# Patient Record
Sex: Female | Born: 1940 | Race: White | Hispanic: No | Smoking: Current every day smoker
Health system: Southern US, Community
[De-identification: ages and names within clinical notes are randomized; demographics above are authoritative.]

## PROBLEM LIST (undated history)

## (undated) DIAGNOSIS — H3530 Unspecified macular degeneration: Secondary | ICD-10-CM

## (undated) DIAGNOSIS — M1990 Unspecified osteoarthritis, unspecified site: Secondary | ICD-10-CM

## (undated) DIAGNOSIS — I10 Essential (primary) hypertension: Secondary | ICD-10-CM

## (undated) DIAGNOSIS — E039 Hypothyroidism, unspecified: Secondary | ICD-10-CM

## (undated) DIAGNOSIS — R0600 Dyspnea, unspecified: Secondary | ICD-10-CM

## (undated) HISTORY — PX: LUMBAR FUSION: SHX111

## (undated) HISTORY — PX: APPENDECTOMY: SHX54

## (undated) HISTORY — PX: HEEL SPUR SURGERY: SHX665

## (undated) HISTORY — PX: CATARACT EXTRACTION: SUR2

## (undated) HISTORY — PX: CERVICAL FUSION: SHX112

## (undated) HISTORY — PX: CHOLECYSTECTOMY: SHX55

## (undated) HISTORY — PX: ABDOMINAL HYSTERECTOMY: SHX81

---

## 2021-08-10 NOTE — Progress Notes (Signed)
Sent message, via epic in basket, requesting orders in epic from surgeon.  

## 2021-08-11 NOTE — Patient Instructions (Addendum)
DUE TO COVID-19 ONLY ONE VISITOR  (aged 79 and older)  IS ALLOWED TO COME WITH YOU AND STAY IN THE WAITING ROOM ONLY DURING PRE OP AND PROCEDURE.   ?**NO VISITORS ARE ALLOWED IN THE SHORT STAY AREA OR RECOVERY ROOM!!**  ? ? Your procedure is scheduled on: 08/17/21 ? ? Report to [facility] Main Entrance ? ?  Report to admitting at 5:30 AM ? ? Call this number if you have problems the morning of surgery [phone] ? ? Do not eat food :After Midnight. ? ? After Midnight you may have the following liquids until 4:30 AM DAY OF SURGERY ? ?Water ?Black Coffee (sugar ok, NO MILK/CREAM OR CREAMERS)  ?Tea (sugar ok, NO MILK/CREAM OR CREAMERS) regular and decaf                             ?Plain Jell-O (NO RED)                                           ?Fruit ices (not with fruit pulp, NO RED)                                     ?Popsicles (NO RED)                                                                  ?Juice: apple, WHITE grape, WHITE cranberry ?Sports drinks like Gatorade (NO RED) ?Clear broth(vegetable,chicken,beef)  ?  ?The day of surgery:  ?Drink ONE (1) Pre-Surgery Clear Ensure at 4:30 AM the morning of surgery. Drink in one sitting. Do not sip.  ?This drink was given to you during your hospital  ?pre-op appointment visit. ?Nothing else to drink after completing the  ?Pre-Surgery Clear Ensure. ?  ?       If you have questions, please contact your surgeon?s office. ? ? ?FOLLOW BOWEL PREP AND ANY ADDITIONAL PRE OP INSTRUCTIONS YOU RECEIVED FROM YOUR SURGEON'S OFFICE!!! ?  ?  ?Oral Hygiene is also important to reduce your risk of infection.                                    ?Remember - BRUSH YOUR TEETH THE MORNING OF SURGERY WITH YOUR REGULAR TOOTHPASTE ? ? Do NOT smoke after Midnight ? ? Take these medicines the morning of surgery with A SIP OF WATER: Tylenol, Norco, Inhalers, Levothyroxine, Zofran ?                  ?           You may not have any metal on your body including hair pins, jewelry, and  body piercing ? ?           Do not wear make-up, lotions, powders, perfumes, or deodorant ? ?Do not wear nail polish including gel and S&S, artificial/acrylic nails, or any other type of covering on natural nails including finger and toenails. If  you have artificial nails, gel coating, etc. that needs to be removed by a nail salon please have this removed prior to surgery or surgery may need to be canceled/ delayed if the surgeon/ anesthesia feels like they are unable to be safely monitored.  ? ?Do not shave  48 hours prior to surgery.  ? ? Do not bring valuables to the hospital. [facility] IS NOT ?            RESPONSIBLE   FOR VALUABLES. ? ? Contacts, dentures or bridgework may not be worn into surgery. ? ? Patients discharged on the day of surgery will not be allowed to drive home.  Someone NEEDS to stay with you for the first 24 hours after anesthesia. ? ?            Please read over the following fact sheets you were given: IF YOU HAVE QUESTIONS ABOUT YOUR PRE-OP INSTRUCTIONS PLEASE CALL [phone]- Fleet ContrasRachel ? ?   [facility] - Preparing for Surgery ?Before surgery, you can play an important role.  Because skin is not sterile, your skin needs to be as free of germs as possible.  You can reduce the number of germs on your skin by washing with CHG (chlorahexidine gluconate) soap before surgery.  CHG is an antiseptic cleaner which kills germs and bonds with the skin to continue killing germs even after washing. ?Please DO NOT use if you have an allergy to CHG or antibacterial soaps.  If your skin becomes reddened/irritated stop using the CHG and inform your nurse when you arrive at Short Stay. ?Do not shave (including legs and underarms) for at least 48 hours prior to the first CHG shower.  You may shave your face/neck. ? ?Please follow these instructions carefully: ? 1.  Shower with CHG Soap the night before surgery and the  morning of surgery. ? 2.  If you choose to wash your hair, wash your hair first as usual  with your normal  shampoo. ? 3.  After you shampoo, rinse your hair and body thoroughly to remove the shampoo.                            ? 4.  Use CHG as you would any other liquid soap.  You can apply chg directly to the skin and wash.  Gently with a scrungie or clean washcloth. ? 5.  Apply the CHG Soap to your body ONLY FROM THE NECK DOWN.   Do   not use on face/ open      ?                     Wound or open sores. Avoid contact with eyes, ears mouth and   genitals (private parts).  ?                     Engineering geologistWash face,  Genitals (private parts) with your normal soap. ?            6.  Wash thoroughly, paying special attention to the area where your    surgery  will be performed. ? 7.  Thoroughly rinse your body with warm water from the neck down. ? 8.  DO NOT shower/wash with your normal soap after using and rinsing off the CHG Soap. ?               9.  Pat yourself dry with  a clean towel. ?           10.  Wear clean pajamas. ?           11.  Place clean sheets on your bed the night of your first shower and do not  sleep with pets. ?Day of Surgery : ?Do not apply any lotions/deodorants the morning of surgery.  Please wear clean clothes to the hospital/surgery center. ? ?FAILURE TO FOLLOW THESE INSTRUCTIONS MAY RESULT IN THE CANCELLATION OF YOUR SURGERY ? ?PATIENT SIGNATURE_________________________________ ? ?NURSE SIGNATURE__________________________________ ? ?________________________________________________________________________  ? ?Incentive Spirometer ? ?An incentive spirometer is a tool that can help keep your lungs clear and active. This tool measures how well you are filling your lungs with each breath. Taking long deep breaths may help reverse or decrease the chance of developing breathing (pulmonary) problems (especially infection) following: ?A long period of time when you are unable to move or be active. ?BEFORE THE PROCEDURE  ?If the spirometer includes an indicator to show your best effort, your nurse or  respiratory therapist will set it to a desired goal. ?If possible, sit up straight or lean slightly forward. Try not to slouch. ?Hold the incentive spirometer in an upright position. ?INSTRUCTIONS FOR USE  ?Sit on the edge of your bed if possible, or sit up as far as you can in bed or on a chair. ?Hold the incentive spirometer in an upright position. ?Breathe out normally. ?Place the mouthpiece in your mouth and seal your lips tightly around it. ?Breathe in slowly and as deeply as possible, raising the piston or the ball toward the top of the column. ?Hold your breath for 3-5 seconds or for as long as possible. Allow the piston or ball to fall to the bottom of the column. ?Remove the mouthpiece from your mouth and breathe out normally. ?Rest for a few seconds and repeat Steps 1 through 7 at least 10 times every 1-2 hours when you are awake. Take your time and take a few normal breaths between deep breaths. ?The spirometer may include an indicator to show your best effort. Use the indicator as a goal to work toward during each repetition. ?After each set of 10 deep breaths, practice coughing to be sure your lungs are clear. If you have an incision (the cut made at the time of surgery), support your incision when coughing by placing a pillow or rolled up towels firmly against it. ?Once you are able to get out of bed, walk around indoors and cough well. You may stop using the incentive spirometer when instructed by your caregiver.  ?RISKS AND COMPLICATIONS ?Take your time so you do not get dizzy or light-headed. ?If you are in pain, you may need to take or ask for pain medication before doing incentive spirometry. It is harder to take a deep breath if you are having pain. ?AFTER USE ?Rest and breathe slowly and easily. ?It can be helpful to keep track of a log of your progress. Your caregiver can provide you with a simple table to help with this. ?If you are using the spirometer at home, follow these  instructions: ?SEEK MEDICAL CARE IF:  ?You are having difficultly using the spirometer. ?You have trouble using the spirometer as often as instructed. ?Your pain medication is not giving enough relief while using the

## 2021-08-12 ENCOUNTER — Encounter (HOSPITAL_COMMUNITY)
Admission: RE | Admit: 2021-08-12 | Discharge: 2021-08-12 | Disposition: A | Discharge status: Home / Self Care | Payer: Medicare Other | Source: Ambulatory Visit | Attending: Orthopaedic Surgery | Admitting: Orthopaedic Surgery

## 2021-08-12 ENCOUNTER — Other Ambulatory Visit: Payer: Self-pay

## 2021-08-12 ENCOUNTER — Encounter (HOSPITAL_COMMUNITY): Payer: Self-pay

## 2021-08-12 ENCOUNTER — Encounter (INDEPENDENT_AMBULATORY_CARE_PROVIDER_SITE_OTHER): Payer: Self-pay

## 2021-08-12 VITALS — BP 158/70 | HR 71 | Temp 98.5°F | Resp 16 | Ht 65.0 in | Wt 144.0 lb

## 2021-08-12 DIAGNOSIS — Z01818 Encounter for other preprocedural examination: Secondary | ICD-10-CM | POA: Insufficient documentation

## 2021-08-12 HISTORY — DX: Dyspnea, unspecified: R06.00

## 2021-08-12 HISTORY — DX: Unspecified osteoarthritis, unspecified site: M19.90

## 2021-08-12 HISTORY — DX: Unspecified macular degeneration: H35.30

## 2021-08-12 HISTORY — DX: Essential (primary) hypertension: I10

## 2021-08-12 HISTORY — DX: Hypothyroidism, unspecified: E03.9

## 2021-08-12 LAB — BASIC METABOLIC PANEL
Anion gap: 6 (ref 5–15)
BUN: 18 mg/dL (ref 8–23)
CO2: 26 mmol/L (ref 22–32)
Calcium: 8.9 mg/dL (ref 8.9–10.3)
Chloride: 101 mmol/L (ref 98–111)
Creatinine, Ser: 0.96 mg/dL (ref 0.44–1.00)
GFR, Estimated: >60 mL/min (ref >60)
Glucose, Bld: 90 mg/dL (ref 70–99)
Potassium: 4.2 mmol/L (ref 3.5–5.1)
Sodium: 133 mmol/L — ABNORMAL LOW (ref 135–145)

## 2021-08-12 LAB — CBC
HCT: 36.2 % (ref 36.0–46.0)
Hemoglobin: 11.6 g/dL — ABNORMAL LOW (ref 12.0–15.0)
MCH: 32.2 pg (ref 26.0–34.0)
MCHC: 32 g/dL (ref 30.0–36.0)
MCV: 100.6 fL — ABNORMAL HIGH (ref 80.0–100.0)
Platelets: 323 10*3/uL (ref 150–400)
RBC: 3.6 MIL/uL — ABNORMAL LOW (ref 3.87–5.11)
RDW: 13.3 % (ref 11.5–15.5)
WBC: 6.9 10*3/uL (ref 4.0–10.5)
nRBC: 0 % (ref 0.0–0.2)

## 2021-08-12 LAB — SURGICAL PCR SCREEN
MRSA, PCR: NEGATIVE
Staphylococcus aureus: NEGATIVE

## 2021-08-12 NOTE — Progress Notes (Addendum)
Patient lives in [place]. Her son lives here and she is staying with him currently. ? ?COVID Vaccine Completed: yes x1 ?Date COVID Vaccine completed: ?Has received booster: ?COVID vaccine manufacturer: [employer]  ? ?Date of COVID positive in last 90 days: no ? ?PCP - Lattie Corns [place] ?Cardiologist - n/a ? ?Chest x-ray - n/a ?EKG - 08/12/21 Epic/chart ?Stress Test - yes 15 plus years per pt ?ECHO - n/a ?Cardiac Cath - n/a ?Pacemaker/ICD device last checked: n/a ?Spinal Cord Stimulator: n/a ? ?Bowel Prep - no ? ?Sleep Study - n/a ?CPAP -  ? ?Fasting Blood Sugar - n/a ?Checks Blood Sugar _____ times a day ? ?Blood Thinner Instructions: n/a ?Aspirin Instructions: ?Last Dose: ? ?Activity level: Can go up a flight of stairs and perform activities of daily living without stopping and without symptoms of chest pain. SOB due to smoking ?   ?Anesthesia review:  ? ?Patient denies shortness of breath, fever, cough and chest pain at PAT appointment ? ? ?Patient verbalized understanding of instructions that were given to them at the PAT appointment. Patient was also instructed that they will need to review over the PAT instructions again at home before surgery.  ?

## 2021-08-14 NOTE — H&P (Signed)
BMI: ?There is no height or weight on file to calculate BMI. ? ?No results found for: ALBUMIN ? ? ?Diabetes: ?Patient does not have a diagnosis of diabetes. ?  ? ?.  ?  ?Smoking Status: ?Social History  ? ?Tobacco Use  ?Smoking Status Every Day  ? Packs/day: 0.50  ? Types: Cigarettes  ?Smokeless Tobacco Never  ? ?Ready to quit: Not Answered ?Counseling given: Not Answered ? ? ?Non-elective surgery.  No smoking cessation applicable. ? ? ? ? ? ? ? ?

## 2021-08-16 NOTE — H&P (Signed)
? ? ?PREOPERATIVE H&P ? ?Chief Complaint: RIGHT SHOULDER FRACTURE ? ?HPI: ?Sara Dillon is a 81 y.o. female who is scheduled for Procedure(s): ?REVERSE SHOULDER ARTHROPLASTY ?OPEN REDUCTION INTERNAL FIXATION (ORIF) PROXIMAL HUMERUS FRACTURE.  ? ?Patient is a 81 year-old female who presents today with a known proximal humerus fracture that occurred on March 12th.  She states that she was in [place] and was coming out of a doorframe when she missed a step.  She fell and landed on her right shoulder.  She was seen in an emergency room in [place] and was told that she had a fracture in her shoulder and that she needed to follow up with an orthopedist for likely surgery.  She has been having pain since then and has been taking Hydrocodone, as well as nausea medication for this.  She was seen by an orthopedist in [place] as well.  She actually lives in [place], but was brought here to stay with family.  Her family is planning to move to on March 30th.   ? ?Past medical history: Significant for hypertension and hypothyroidism.  She does not have a history of diabetes specifically. Her primary care provider is Dr. Lattie Corns in [place] with the [facility].   ? ?Current medications: Losartan and Levothyroxine 88 mcg.  She is not on any blood thinners.   ? ?Symptoms are rated as moderate to severe, and have been worsening.  This is significantly impairing activities of daily living.   ? ?Please see clinic note for further details on this patient's care.   ? ?She has elected for surgical management.  ? ?Past Medical History:  ?Diagnosis Date  ? Arthritis   ? Dyspnea   ? Hypertension   ? Hypothyroidism   ? Macular degeneration   ? ?Past Surgical History:  ?Procedure Laterality Date  ? ABDOMINAL HYSTERECTOMY    ? APPENDECTOMY    ? CATARACT EXTRACTION Bilateral   ? CERVICAL FUSION    ? CHOLECYSTECTOMY    ? HEEL SPUR SURGERY Left   ? LUMBAR FUSION    ? ?Social History  ? ?Socioeconomic History  ? Marital status:  Widowed  ?  Spouse name: Not on file  ? Number of children: Not on file  ? Years of education: Not on file  ? Highest education level: Not on file  ?Occupational History  ? Not on file  ?Tobacco Use  ? Smoking status: Every Day  ?  Packs/day: 0.50  ?  Types: Cigarettes  ? Smokeless tobacco: Never  ?Vaping Use  ? Vaping Use: Never used  ?Substance and Sexual Activity  ? Alcohol use: Yes  ?  Alcohol/week: 7.0 standard drinks  ?  Types: 7 Glasses of wine per week  ? Drug use: Never  ? Sexual activity: Not on file  ?Other Topics Concern  ? Not on file  ?Social History Narrative  ? Not on file  ? ?Social Determinants of Health  ? ?Financial Resource Strain: Not on file  ?Food Insecurity: Not on file  ?Transportation Needs: Not on file  ?Physical Activity: Not on file  ?Stress: Not on file  ?Social Connections: Not on file  ? ?No family history on file. ?Allergies  ?Allergen Reactions  ? Hydrocodone Nausea And Vomiting  ? Morphine Nausea And Vomiting  ? ?Prior to Admission medications   ?Medication Sig Start Date End Date Taking? Authorizing Provider  ?acetaminophen (TYLENOL) 325 MG tablet Take 650 mg by mouth every 6 (six) hours as needed for  moderate pain.   Yes [provider]  ?acidophilus (RISAQUAD) CAPS capsule Take 1 capsule by mouth daily.   Yes [provider]  ?Calcium Carb-Cholecalciferol (CALTRATE 600+D3 PO) Take 1 tablet by mouth daily.   Yes [provider]  ?cholecalciferol (VITAMIN D3) 25 MCG (1000 UNIT) tablet Take 1,000 Units by mouth daily.   Yes [provider]  ?HYDROcodone-acetaminophen (NORCO) 10-325 MG tablet Take 1 tablet by mouth every 6 (six) hours as needed for moderate pain.   Yes [provider]  ?levalbuterol (XOPENEX HFA) 45 MCG/ACT inhaler Inhale 1-2 puffs into the lungs every 4 (four) hours as needed for wheezing or shortness of breath.   Yes [provider]  ?levothyroxine (SYNTHROID) 88 MCG tablet Take 88 mcg by mouth daily before  breakfast.   Yes [provider]  ?losartan (COZAAR) 25 MG tablet Take 25 mg by mouth daily.   Yes [provider]  ?Multiple Vitamin (MULTIVITAMIN WITH MINERALS) TABS tablet Take 1 tablet by mouth daily.   Yes [provider]  ?ondansetron (ZOFRAN) 4 MG tablet Take 4 mg by mouth every 8 (eight) hours as needed for nausea or vomiting.   Yes [provider]  ?Phenazopyridine HCl (AZO TABS PO) Take 1 tablet by mouth daily.   Yes [provider]  ?Polyethyl Glycol-Propyl Glycol (SYSTANE OP) Place 1 drop into both eyes 2 (two) times daily as needed (dry eyes).   Yes [provider]  ?vitamin B-12 (CYANOCOBALAMIN) 500 MCG tablet Take 500 mcg by mouth daily.   Yes [provider]  ? ? ?ROS: All other systems have been reviewed and were otherwise negative with the exception of those mentioned in the HPI and as above. ? ?Physical Exam: ?General: Alert, no acute distress ?Cardiovascular: No pedal edema ?Respiratory: No cyanosis, no use of accessory musculature ?GI: No organomegaly, abdomen is soft and non-tender ?Skin: No lesions in the area of chief complaint ?Neurologic: Sensation intact distally ?Psychiatric: Patient is competent for consent with normal mood and affect ?Lymphatic: No axillary or cervical lymphadenopathy ? ?MUSCULOSKELETAL:  ?On examination of the right shoulder she is tender to palpation about the proximal humerus.  She endorses axillary nerve sensation which is symmetric to the contralateral side. The range of motion is not tested in the setting of a known fracture.  Positive motor function in AIN PIN ulnar distribution.  She endorses distal sensation. Warm, well perfused hand.  ? ?Imaging: ?X-rays demonstrate a complex proximal humerus fracture.  ? ?BMI: ?There is no height or weight on file to calculate BMI. ? ?No results found for: ALBUMIN ? ? ?Diabetes: ?Patient does not have a diagnosis of diabetes. ?  ? ?.  ?  ?Smoking Status: ?Social  History  ? ?Tobacco Use  ?Smoking Status Every Day  ? Packs/day: 0.50  ? Types: Cigarettes  ?Smokeless Tobacco Never  ? ?Ready to quit: Not Answered ?Counseling given: Not Answered ? ?Patient was counseled on importance of smoking cessation.She did not participate in 4 week cessation program as surgery was scheduled in an urgent manner due to patient's displaced proximal humerus fracture. ? ? ? ? ?Assessment: ?RIGHT SHOULDER FRACTURE ? ?Plan: ?Plan for Procedure(s): ?REVERSE SHOULDER ARTHROPLASTY ?OPEN REDUCTION INTERNAL FIXATION (ORIF) PROXIMAL HUMERUS FRACTURE ? ? ?The risks benefits and alternatives were discussed with the patient including but not limited to the risks of nonoperative treatment, versus surgical intervention including infection, bleeding, nerve injury,  blood clots, cardiopulmonary complications, morbidity, mortality, among others, and they were  willing to proceed.  ? ?We additionally specifically discussed risks of axillary nerve injury, infection, periprosthetic fracture, continued pain and longevity of implants prior to beginning procedure.  ?  ?Patient will be closely monitored in PACU for medical stabilization and pain control. If found stable in PACU, patient may be discharged home with outpatient follow-up. If any concerns regarding patient's stabilization patient will be admitted for observation after surgery. The patient is planning to be discharged home with outpatient PT.  ? ?The patient acknowledged the explanation, agreed to proceed with the plan and consent was signed.  ? ?Operative Plan: Right reverse total shoulder arthroplasty for fracture ?Discharge Medications: Standard ?DVT Prophylaxis: Aspirin ?Physical Therapy: Outpatient PT ?Special Discharge needs: Sling[employer] ? ? ?Vernetta Honeyaroline N Mailen Newborn, PA-C ? ?08/16/2021 ?8:59 PM ? ?

## 2021-08-16 NOTE — Anesthesia Preprocedure Evaluation (Addendum)
Anesthesia Evaluation  ?Patient identified by MRN, date of birth, ID band ?Patient awake ? ? ? ?Reviewed: ?Allergy & Precautions, NPO status , Patient's Chart, lab work & pertinent test results ? ?History of Anesthesia Complications ?Negative for: history of anesthetic complications ? ?Airway ?Mallampati: II ? ?TM Distance: >3 FB ?Neck ROM: Full ? ? ? Dental ? ?(+) Dental Advisory Given, Partial Upper ?  ?Pulmonary ?Current Smoker and Patient abstained from smoking.,  ?  ?Pulmonary exam normal ? ? ? ? ? ? ? Cardiovascular ?hypertension, Pt. on medications ?Normal cardiovascular exam ? ? ?  ?Neuro/Psych ?negative neurological ROS ?   ? GI/Hepatic ?negative GI ROS, Neg liver ROS,   ?Endo/Other  ?Hypothyroidism  ? Renal/GU ?negative Renal ROS  ?negative genitourinary ?  ?Musculoskeletal ? ?(+) Arthritis ,  ? Abdominal ?  ?Peds ? Hematology ? ?(+) Blood dyscrasia, anemia ,   ?Anesthesia Other Findings ? ? Reproductive/Obstetrics ? ?  ? ? ? ? ? ? ? ? ? ? ? ? ? ?  ?  ? ? ? ? ? ? ?Anesthesia Physical ?Anesthesia Plan ? ?ASA: 2 ? ?Anesthesia Plan: General  ? ?Post-op Pain Management: Regional block* and Tylenol PO (pre-op)*  ? ?Induction: Intravenous ? ?PONV Risk Score and Plan: 2 and Ondansetron, Dexamethasone, Treatment may vary due to age or medical condition and Midazolam ? ?Airway Management Planned: Oral ETT ? ?Additional Equipment: None ? ?Intra-op Plan:  ? ?Post-operative Plan: Extubation in OR ? ?Informed Consent: I have reviewed the patients History and Physical, chart, labs and discussed the procedure including the risks, benefits and alternatives for the proposed anesthesia with the patient or authorized representative who has indicated his/her understanding and acceptance.  ? ? ? ?Dental advisory given ? ?Plan Discussed with:  ? ?Anesthesia Plan Comments:   ? ? ? ? ? ?Anesthesia Quick Evaluation ? ?

## 2021-08-17 ENCOUNTER — Ambulatory Visit (HOSPITAL_BASED_OUTPATIENT_CLINIC_OR_DEPARTMENT_OTHER): Payer: Medicare Other | Admitting: Anesthesiology

## 2021-08-17 ENCOUNTER — Ambulatory Visit (HOSPITAL_COMMUNITY): Payer: Medicare Other

## 2021-08-17 ENCOUNTER — Encounter (HOSPITAL_COMMUNITY)
Admission: RE | Disposition: A | Discharge status: Home / Self Care | Payer: Self-pay | Source: Home / Self Care | Attending: Orthopaedic Surgery

## 2021-08-17 ENCOUNTER — Other Ambulatory Visit: Payer: Self-pay

## 2021-08-17 ENCOUNTER — Ambulatory Visit (HOSPITAL_COMMUNITY): Payer: Medicare Other | Admitting: Anesthesiology

## 2021-08-17 ENCOUNTER — Ambulatory Visit (HOSPITAL_COMMUNITY)
Admission: RE | Admit: 2021-08-17 | Discharge: 2021-08-17 | Disposition: A | Discharge status: Home / Self Care | Payer: Medicare Other | Attending: Orthopaedic Surgery | Admitting: Orthopaedic Surgery

## 2021-08-17 ENCOUNTER — Encounter (HOSPITAL_COMMUNITY): Payer: Self-pay | Admitting: Orthopaedic Surgery

## 2021-08-17 DIAGNOSIS — Z79890 Hormone replacement therapy (postmenopausal): Secondary | ICD-10-CM | POA: Diagnosis not present

## 2021-08-17 DIAGNOSIS — X58XXXA Exposure to other specified factors, initial encounter: Secondary | ICD-10-CM | POA: Diagnosis not present

## 2021-08-17 DIAGNOSIS — F17210 Nicotine dependence, cigarettes, uncomplicated: Secondary | ICD-10-CM | POA: Diagnosis not present

## 2021-08-17 DIAGNOSIS — Z79899 Other long term (current) drug therapy: Secondary | ICD-10-CM | POA: Diagnosis not present

## 2021-08-17 DIAGNOSIS — S42251A Displaced fracture of greater tuberosity of right humerus, initial encounter for closed fracture: Secondary | ICD-10-CM | POA: Insufficient documentation

## 2021-08-17 DIAGNOSIS — I10 Essential (primary) hypertension: Secondary | ICD-10-CM | POA: Insufficient documentation

## 2021-08-17 DIAGNOSIS — E039 Hypothyroidism, unspecified: Secondary | ICD-10-CM | POA: Insufficient documentation

## 2021-08-17 DIAGNOSIS — S42201A Unspecified fracture of upper end of right humerus, initial encounter for closed fracture: Secondary | ICD-10-CM | POA: Diagnosis not present

## 2021-08-17 HISTORY — PX: REVERSE SHOULDER ARTHROPLASTY: SHX5054

## 2021-08-17 HISTORY — PX: ORIF HUMERUS FRACTURE: SHX2126

## 2021-08-17 SURGERY — ARTHROPLASTY, SHOULDER, TOTAL, REVERSE
Anesthesia: General | Site: Shoulder | Laterality: Right

## 2021-08-17 MED ORDER — VANCOMYCIN HCL 1 G IV SOLR
INTRAVENOUS | Status: DC | PRN
Start: 2021-08-17 — End: 2021-08-17
  Administered 2021-08-17: 1000 mg via TOPICAL

## 2021-08-17 MED ORDER — SUGAMMADEX SODIUM 200 MG/2ML IV SOLN
INTRAVENOUS | Status: DC | PRN
  Administered 2021-08-17: 200 mg via INTRAVENOUS

## 2021-08-17 MED ORDER — MIDAZOLAM HCL 5 MG/5ML IJ SOLN
INTRAMUSCULAR | Status: DC | PRN
  Administered 2021-08-17 (×2): 1 mg via INTRAVENOUS

## 2021-08-17 MED ORDER — PHENYLEPHRINE HCL-NACL 20-0.9 MG/250ML-% IV SOLN
INTRAVENOUS | Status: AC
Start: 2021-08-17 — End: 2021-08-17
  Filled 2021-08-17: qty 250

## 2021-08-17 MED ORDER — ALBUTEROL SULFATE (2.5 MG/3ML) 0.083% IN NEBU
2.5000 mg | INHALATION_SOLUTION | Freq: Once | RESPIRATORY_TRACT | Status: AC
  Administered 2021-08-17: 2.5 mg via RESPIRATORY_TRACT

## 2021-08-17 MED ORDER — LIDOCAINE HCL (PF) 2 % IJ SOLN
INTRAMUSCULAR | Status: AC
  Filled 2021-08-17: qty 5

## 2021-08-17 MED ORDER — ACETAMINOPHEN 500 MG PO TABS
1000.0000 mg | ORAL_TABLET | Freq: Once | ORAL | Status: AC
  Administered 2021-08-17: 1000 mg via ORAL
  Filled 2021-08-17: qty 2

## 2021-08-17 MED ORDER — OXYCODONE HCL 5 MG PO TABS: 5.0000 mg | ORAL_TABLET | Freq: Once | ORAL | Status: DC | PRN

## 2021-08-17 MED ORDER — AMISULPRIDE (ANTIEMETIC) 5 MG/2ML IV SOLN: 10.0000 mg | Freq: Once | INTRAVENOUS | Status: AC | PRN

## 2021-08-17 MED ORDER — LACTATED RINGERS IV BOLUS
250.0000 mL | Freq: Once | INTRAVENOUS | Status: AC
  Administered 2021-08-17: 250 mL via INTRAVENOUS

## 2021-08-17 MED ORDER — SODIUM CHLORIDE 0.9 % IR SOLN
Status: DC | PRN
  Administered 2021-08-17: 1000 mL

## 2021-08-17 MED ORDER — ONDANSETRON HCL 4 MG/2ML IJ SOLN
INTRAMUSCULAR | Status: AC
  Filled 2021-08-17: qty 2

## 2021-08-17 MED ORDER — VANCOMYCIN HCL 1000 MG IV SOLR
INTRAVENOUS | Status: AC
  Filled 2021-08-17: qty 20

## 2021-08-17 MED ORDER — PROPOFOL 10 MG/ML IV BOLUS
INTRAVENOUS | Status: DC | PRN
Start: 2021-08-17 — End: 2021-08-17
  Administered 2021-08-17: 120 mg via INTRAVENOUS

## 2021-08-17 MED ORDER — OXYCODONE HCL 5 MG PO TABS: ORAL_TABLET | ORAL | 0 refills | Status: AC

## 2021-08-17 MED ORDER — PROPOFOL 10 MG/ML IV BOLUS
INTRAVENOUS | Status: AC
  Filled 2021-08-17: qty 20

## 2021-08-17 MED ORDER — TRANEXAMIC ACID-NACL 1000-0.7 MG/100ML-% IV SOLN
1000.0000 mg | INTRAVENOUS | Status: AC
  Administered 2021-08-17: 1000 mg via INTRAVENOUS
  Filled 2021-08-17: qty 100

## 2021-08-17 MED ORDER — ROCURONIUM BROMIDE 10 MG/ML (PF) SYRINGE
PREFILLED_SYRINGE | INTRAVENOUS | Status: AC
  Filled 2021-08-17: qty 10

## 2021-08-17 MED ORDER — ALBUTEROL SULFATE (2.5 MG/3ML) 0.083% IN NEBU
INHALATION_SOLUTION | RESPIRATORY_TRACT | Status: AC
  Filled 2021-08-17: qty 3

## 2021-08-17 MED ORDER — ORAL CARE MOUTH RINSE: 15.0000 mL | Freq: Once | OROMUCOSAL | Status: AC

## 2021-08-17 MED ORDER — ACETAMINOPHEN 500 MG PO TABS: 1000.0000 mg | ORAL_TABLET | Freq: Once | ORAL | Status: DC

## 2021-08-17 MED ORDER — ACETAMINOPHEN 500 MG PO TABS: 1000.0000 mg | ORAL_TABLET | Freq: Three times a day (TID) | ORAL | 0 refills | Status: AC

## 2021-08-17 MED ORDER — ONDANSETRON HCL 4 MG/2ML IJ SOLN: 4.0000 mg | Freq: Once | INTRAMUSCULAR | Status: DC | PRN

## 2021-08-17 MED ORDER — 0.9 % SODIUM CHLORIDE (POUR BTL) OPTIME
TOPICAL | Status: DC | PRN
  Administered 2021-08-17: 1000 mL

## 2021-08-17 MED ORDER — CHLORHEXIDINE GLUCONATE 0.12 % MT SOLN
15.0000 mL | Freq: Once | OROMUCOSAL | Status: AC
  Administered 2021-08-17: 15 mL via OROMUCOSAL

## 2021-08-17 MED ORDER — BUPIVACAINE HCL (PF) 0.5 % IJ SOLN
INTRAMUSCULAR | Status: DC | PRN
Start: 2021-08-17 — End: 2021-08-17
  Administered 2021-08-17: 15 mL via PERINEURAL

## 2021-08-17 MED ORDER — BUPIVACAINE LIPOSOME 1.3 % IJ SUSP
INTRAMUSCULAR | Status: DC | PRN
  Administered 2021-08-17: 10 mL

## 2021-08-17 MED ORDER — ESMOLOL HCL 100 MG/10ML IV SOLN
INTRAVENOUS | Status: AC
  Filled 2021-08-17: qty 10

## 2021-08-17 MED ORDER — FENTANYL CITRATE PF 50 MCG/ML IJ SOSY: 25.0000 ug | PREFILLED_SYRINGE | INTRAMUSCULAR | Status: DC | PRN

## 2021-08-17 MED ORDER — FENTANYL CITRATE PF 50 MCG/ML IJ SOSY
PREFILLED_SYRINGE | INTRAMUSCULAR | Status: AC
  Administered 2021-08-17: 25 ug via INTRAVENOUS
  Filled 2021-08-17: qty 2

## 2021-08-17 MED ORDER — CEFAZOLIN SODIUM-DEXTROSE 2-4 GM/100ML-% IV SOLN
2.0000 g | INTRAVENOUS | Status: AC
  Administered 2021-08-17: 2 g via INTRAVENOUS
  Filled 2021-08-17: qty 100

## 2021-08-17 MED ORDER — ASPIRIN 81 MG PO CHEW: 81.0000 mg | CHEWABLE_TABLET | Freq: Two times a day (BID) | ORAL | 0 refills | Status: AC

## 2021-08-17 MED ORDER — PHENYLEPHRINE 40 MCG/ML (10ML) SYRINGE FOR IV PUSH (FOR BLOOD PRESSURE SUPPORT)
PREFILLED_SYRINGE | INTRAVENOUS | Status: AC
  Filled 2021-08-17: qty 10

## 2021-08-17 MED ORDER — DEXAMETHASONE SODIUM PHOSPHATE 4 MG/ML IJ SOLN
INTRAMUSCULAR | Status: DC | PRN
  Administered 2021-08-17: 8 mg via INTRAVENOUS

## 2021-08-17 MED ORDER — ONDANSETRON HCL 4 MG PO TABS: 4.0000 mg | ORAL_TABLET | Freq: Three times a day (TID) | ORAL | 0 refills | Status: AC | PRN

## 2021-08-17 MED ORDER — DEXAMETHASONE SODIUM PHOSPHATE 10 MG/ML IJ SOLN
INTRAMUSCULAR | Status: AC
  Filled 2021-08-17: qty 1

## 2021-08-17 MED ORDER — OXYCODONE HCL 5 MG/5ML PO SOLN: 5.0000 mg | Freq: Once | ORAL | Status: DC | PRN

## 2021-08-17 MED ORDER — AMISULPRIDE (ANTIEMETIC) 5 MG/2ML IV SOLN
INTRAVENOUS | Status: AC
  Administered 2021-08-17: 10 mg via INTRAVENOUS
  Filled 2021-08-17: qty 4

## 2021-08-17 MED ORDER — LACTATED RINGERS IV BOLUS
500.0000 mL | Freq: Once | INTRAVENOUS | Status: AC
  Administered 2021-08-17: 500 mL via INTRAVENOUS

## 2021-08-17 MED ORDER — FENTANYL CITRATE (PF) 250 MCG/5ML IJ SOLN
INTRAMUSCULAR | Status: AC
  Filled 2021-08-17: qty 5

## 2021-08-17 MED ORDER — PHENYLEPHRINE 40 MCG/ML (10ML) SYRINGE FOR IV PUSH (FOR BLOOD PRESSURE SUPPORT)
PREFILLED_SYRINGE | INTRAVENOUS | Status: DC | PRN
  Administered 2021-08-17: 80 ug via INTRAVENOUS
  Administered 2021-08-17 (×2): 120 ug via INTRAVENOUS
  Administered 2021-08-17 (×2): 80 ug via INTRAVENOUS
  Administered 2021-08-17: 40 ug via INTRAVENOUS
  Administered 2021-08-17: 120 ug via INTRAVENOUS

## 2021-08-17 MED ORDER — MIDAZOLAM HCL 2 MG/2ML IJ SOLN
INTRAMUSCULAR | Status: AC
  Filled 2021-08-17: qty 2

## 2021-08-17 MED ORDER — GABAPENTIN 300 MG PO CAPS
300.0000 mg | ORAL_CAPSULE | Freq: Once | ORAL | Status: AC
  Administered 2021-08-17: 300 mg via ORAL
  Filled 2021-08-17: qty 1

## 2021-08-17 MED ORDER — ROCURONIUM BROMIDE 10 MG/ML (PF) SYRINGE
PREFILLED_SYRINGE | INTRAVENOUS | Status: DC | PRN
  Administered 2021-08-17: 60 mg via INTRAVENOUS

## 2021-08-17 MED ORDER — LACTATED RINGERS IV SOLN: INTRAVENOUS | Status: DC

## 2021-08-17 MED ORDER — ESMOLOL HCL 100 MG/10ML IV SOLN
INTRAVENOUS | Status: DC | PRN
  Administered 2021-08-17: 10 mg via INTRAVENOUS
  Administered 2021-08-17: 30 mg via INTRAVENOUS

## 2021-08-17 MED ORDER — PHENYLEPHRINE HCL-NACL 20-0.9 MG/250ML-% IV SOLN
INTRAVENOUS | Status: DC | PRN
  Administered 2021-08-17: 20 ug/min via INTRAVENOUS

## 2021-08-17 MED ORDER — MELOXICAM 15 MG PO TABS: 15.0000 mg | ORAL_TABLET | Freq: Every day | ORAL | 0 refills | Status: AC

## 2021-08-17 MED ORDER — OMEPRAZOLE 20 MG PO CPDR: 20.0000 mg | DELAYED_RELEASE_CAPSULE | Freq: Every day | ORAL | 0 refills | Status: AC

## 2021-08-17 SURGICAL SUPPLY — 89 items
BAG COUNTER SPONGE SURGICOUNT (BAG) ×3 IMPLANT
BASEPLATE GLENOSPHERE 25 STD (Miscellaneous) ×1 IMPLANT
BIT DRILL 3.2 PERIPHERAL SCREW (BIT) ×1 IMPLANT
BLADE SAW SAG 73X25 THK (BLADE) ×1
BLADE SAW SGTL 73X25 THK (BLADE) ×2 IMPLANT
CHLORAPREP W/TINT 26 (MISCELLANEOUS) ×6 IMPLANT
CLSR STERI-STRIP ANTIMIC 1/2X4 (GAUZE/BANDAGES/DRESSINGS) ×3 IMPLANT
COOLER ICEMAN CLASSIC (MISCELLANEOUS) ×1 IMPLANT
COVER BACK TABLE 60X90IN (DRAPES) IMPLANT
COVER SURGICAL LIGHT HANDLE (MISCELLANEOUS) ×3 IMPLANT
DRAPE C-ARM 42X120 X-RAY (DRAPES) ×2 IMPLANT
DRAPE INCISE IOBAN 66X45 STRL (DRAPES) ×3 IMPLANT
DRAPE ORTHO SPLIT 77X108 STRL (DRAPES) ×2
DRAPE SHEET LG 3/4 BI-LAMINATE (DRAPES) ×6 IMPLANT
DRAPE SURG ORHT 6 SPLT 77X108 (DRAPES) ×4 IMPLANT
DRAPE TOP 10253 STERILE (DRAPES) ×3 IMPLANT
DRSG AQUACEL AG ADV 3.5X 4 (GAUZE/BANDAGES/DRESSINGS) ×4 IMPLANT
DRSG AQUACEL AG ADV 3.5X 6 (GAUZE/BANDAGES/DRESSINGS) ×3 IMPLANT
DRSG MEPILEX BORDER 4X8 (GAUZE/BANDAGES/DRESSINGS) ×3 IMPLANT
DURAPREP 26ML APPLICATOR (WOUND CARE) ×3 IMPLANT
ELECT BLADE TIP CTD 4 INCH (ELECTRODE) ×3 IMPLANT
ELECT PENCIL ROCKER SW 15FT (MISCELLANEOUS) IMPLANT
ELECT REM PT RETURN 15FT ADLT (MISCELLANEOUS) ×3 IMPLANT
FACESHIELD WRAPAROUND (MASK) ×6 IMPLANT
FACESHIELD WRAPAROUND OR TEAM (MASK) ×2 IMPLANT
GLENOSPHERE REV SHOULDER 36 (Joint) ×1 IMPLANT
GLOVE SRG 8 PF TXTR STRL LF DI (GLOVE) ×2 IMPLANT
GLOVE SURG ENC MOIS LTX SZ6.5 (GLOVE) ×6 IMPLANT
GLOVE SURG ENC MOIS LTX SZ8 (GLOVE) ×6 IMPLANT
GLOVE SURG NEOPR MICRO LF SZ8 (GLOVE) ×6 IMPLANT
GLOVE SURG UNDER POLY LF SZ6.5 (GLOVE) ×3 IMPLANT
GLOVE SURG UNDER POLY LF SZ8 (GLOVE) ×1
GOWN STRL REUS W/ TWL LRG LVL3 (GOWN DISPOSABLE) ×2 IMPLANT
GOWN STRL REUS W/TWL LRG LVL3 (GOWN DISPOSABLE) ×1
GUIDEWIRE GLENOID 2.5X220 (WIRE) ×1 IMPLANT
HANDPIECE INTERPULSE COAX TIP (DISPOSABLE) ×1
INSERT HUMERAL 36X6MM 12.5DEG (Insert) ×1 IMPLANT
KIT BASIN OR (CUSTOM PROCEDURE TRAY) ×3 IMPLANT
KIT STABILIZATION SHOULDER (MISCELLANEOUS) ×3 IMPLANT
KIT TURNOVER KIT A (KITS) IMPLANT
MANIFOLD NEPTUNE II (INSTRUMENTS) ×3 IMPLANT
NDL MAYO CATGUT SZ4 TPR NDL (NEEDLE) IMPLANT
NDL TROCAR POINT SZ 2 1/2 (NEEDLE) IMPLANT
NEEDLE MAYO CATGUT SZ4 (NEEDLE) IMPLANT
NEEDLE TROCAR POINT SZ 2 1/2 (NEEDLE) ×3 IMPLANT
NS IRRIG 1000ML POUR BTL (IV SOLUTION) ×3 IMPLANT
PACK SHOULDER (CUSTOM PROCEDURE TRAY) ×3 IMPLANT
PAD COLD SHLDR WRAP-ON (PAD) ×1 IMPLANT
RESTRAINT HEAD UNIVERSAL NS (MISCELLANEOUS) ×3 IMPLANT
SCREW 5.5X22 (Screw) ×1 IMPLANT
SCREW BONE THREAD 6.5X35 (Screw) ×1 IMPLANT
SCREW PERIPHERAL 30 (Screw) ×1 IMPLANT
SET HNDPC FAN SPRY TIP SCT (DISPOSABLE) ×2 IMPLANT
SLEEVE SCD COMPRESS KNEE MED (STOCKING) IMPLANT
SLING ARM FOAM STRAP LRG (SOFTGOODS) ×2 IMPLANT
SLING ARM FOAM STRAP MED (SOFTGOODS) IMPLANT
SLING ARM FOAM STRAP XLG (SOFTGOODS) IMPLANT
SLING ARM IMMOBILIZER LRG (SOFTGOODS) IMPLANT
SLING ARM IMMOBILIZER MED (SOFTGOODS) IMPLANT
SLING ULTRA II L (ORTHOPEDIC SUPPLIES) IMPLANT
SLING ULTRA III MED (ORTHOPEDIC SUPPLIES) ×3 IMPLANT
SPIKE FLUID TRANSFER (MISCELLANEOUS) IMPLANT
SPONGE T-LAP 18X18 ~~LOC~~+RFID (SPONGE) ×2 IMPLANT
SPONGE T-LAP 4X18 ~~LOC~~+RFID (SPONGE) ×2 IMPLANT
STAPLER VISISTAT 35W (STAPLE) IMPLANT
STEM HUMERAL SZ4BX104 132.5D (Joint) ×1 IMPLANT
STRIP CLOSURE SKIN 1/2X4 (GAUZE/BANDAGES/DRESSINGS) ×3 IMPLANT
SUCTION FRAZIER HANDLE 12FR (TUBING) ×1
SUCTION TUBE FRAZIER 12FR DISP (TUBING) ×2 IMPLANT
SUT ETHIBOND 2 OS 4 DA (SUTURE) IMPLANT
SUT ETHIBOND 2 V 37 (SUTURE) ×3 IMPLANT
SUT ETHIBOND NAB CT1 #1 30IN (SUTURE) ×3 IMPLANT
SUT ETHILON 3 0 PS 1 (SUTURE) IMPLANT
SUT FIBERWIRE #2 38 T-5 BLUE (SUTURE)
SUT FIBERWIRE #5 38 CONV NDL (SUTURE) ×18
SUT MNCRL AB 4-0 PS2 18 (SUTURE) ×3 IMPLANT
SUT VIC AB 0 CT1 27 (SUTURE) ×1
SUT VIC AB 0 CT1 27XBRD ANBCTR (SUTURE) ×2 IMPLANT
SUT VIC AB 0 CT1 36 (SUTURE) IMPLANT
SUT VIC AB 2-0 SH 27 (SUTURE)
SUT VIC AB 2-0 SH 27XBRD (SUTURE) IMPLANT
SUT VIC AB 3-0 SH 27 (SUTURE) ×1
SUT VIC AB 3-0 SH 27X BRD (SUTURE) ×2 IMPLANT
SUTURE FIBERWR #2 38 T-5 BLUE (SUTURE) ×4 IMPLANT
SUTURE FIBERWR #5 38 CONV NDL (SUTURE) IMPLANT
TOWEL OR 17X26 10 PK STRL BLUE (TOWEL DISPOSABLE) ×3 IMPLANT
TRAY SHOULDER REV OFFSET 1.5 (Joint) ×1 IMPLANT
TUBE SUCTION HIGH CAP CLEAR NV (SUCTIONS) ×3 IMPLANT
WATER STERILE IRR 1000ML POUR (IV SOLUTION) ×6 IMPLANT

## 2021-08-17 NOTE — Anesthesia Postprocedure Evaluation (Signed)
Anesthesia Post Note ? ?Patient: Sara Dillon ? ?Procedure(s) Performed: REVERSE SHOULDER ARTHROPLASTY (Right: Shoulder) ?OPEN REDUCTION INTERNAL FIXATION (ORIF) PROXIMAL HUMERUS FRACTURE (Right) ? ?  ? ?Patient location during evaluation: PACU ?Anesthesia Type: General ?Level of consciousness: awake and alert ?Pain management: pain level controlled ?Vital Signs Assessment: post-procedure vital signs reviewed and stable ?Respiratory status: spontaneous breathing, nonlabored ventilation and respiratory function stable ?Cardiovascular status: blood pressure returned to baseline and stable ?Postop Assessment: no apparent nausea or vomiting ?Anesthetic complications: no ? ? ?No notable events documented. ? ?Last Vitals:  ?Vitals:  ? 08/17/21 1015 08/17/21 1021  ?BP: 134/65   ?Pulse: 79 79  ?Resp: 15 16  ?Temp:    ?SpO2: (!) 87% 91%  ?  ?Last Pain:  ?Vitals:  ? 08/17/21 1000  ?TempSrc:   ?PainSc: 4   ? ? ?  ?  ?  ?  ?  ?  ? ?Lucretia Kern ? ? ? ? ?

## 2021-08-17 NOTE — Op Note (Signed)
Orthopaedic Surgery Operative Note (CSN: [id number]) ? ?Sara Dillon  08-16-1940 ?Date of Surgery: 08/17/2021 ? ? ?Diagnoses:  ?Right proximal humerus fracture ? ?Procedure: ?Right reverse total Shoulder Arthroplasty ?Right tuberosity open reduction internal fixation ?  ?Operative Finding ?Successful completion of planned procedure.  Patient's bone quality was moderate.  Her tuberosities were comminuted and her humeral head was clearly in so much valgus that would would have not been our ability to articulate with the joint.  Axillary nerve tug test was normal.  Good fixation with our stem. ? ?Of note we discussed with radiology that there initial impression of our x-rays were that there was a periprosthetic fracture.  They were unaware that there was a preoperative fracture that we were fixing.  They acknowledged and addended the report. ? ?Post-operative plan: The patient will be NWB in sling.  The patient will be will be discharged from PACU if continues to be stable as was plan prior to surgery.  DVT prophylaxis Aspirin 81 mg twice daily for 6 weeks.  Pain control with PRN pain medication preferring oral medicines.  Follow up plan will be scheduled in approximately 7 days for incision check and XR.  Physical therapy to start after 4 weeks. ? ?Implants: Tornier flex size 4B's long stem, 0 low offset tray with a +6 polyethylene, 36 standard glenosphere with a 25 baseplate and a 40 center screw ? ?Post-Op Diagnosis: Same ?Surgeons:Primary: Hiram Gash, MD ?Assistants:Caroline McBane PA-C ?Location: [facility] ROOM 06 ?Anesthesia: General with Exparel Interscalene ?Antibiotics: Ancef 2g preop, Vancomycin 1000mg  locally ?Tourniquet time: None ?Estimated Blood Loss: 100 ?Complications: None ?Specimens: None ?Implants: ?Implant Name Type Inv. Item Serial No. Manufacturer Lot No. LRB No. Used Action  ?BASEPLATE GLENOSPHERE [id number] STD - [id number] Miscellaneous BASEPLATE GLENOSPHERE [id number] STD [id number] [employer]  Right 1  Implanted  ?BONE SCREW THREAD 6.5X35MM - [id number] Screw BONE SCREW THREAD 6.5X35MM  [employer]  Right 1 Implanted  ?SCREW PERIPHERAL 30 - [id number] Screw SCREW PERIPHERAL 30  [employer]  Right 1 Implanted  ?SCREW 5.5X22 - [id number] Screw SCREW 5.5X22  [employer]  Right 1 Implanted  ?GLENOSPHERE REV SHOULDER 36 - [id number] Joint GLENOSPHERE REV SHOULDER 36 [id number] [employer]  Right 1 Implanted  ?INSERT HUMERAL 36X6MM 12.5DEG - [id number] Insert INSERT HUMERAL 36X6MM 12.5DEG [id number] [employer]  Right 1 Implanted  ?TRAY SHOULDER REV OFFSET 1.5 - [id number] Joint TRAY SHOULDER REV OFFSET 1.5 [id number] [employer]  Right 1 Implanted  ?STEM HUMERAL [id number] 132.5D - [id number] Joint STEM HUMERAL [id number] 132.5D [id number] [employer]  Right 1 Implanted  ? ? ?Indications for Surgery:   ?Sara Dillon is a 81 y.o. female with proximal humerus fracture that was displaced.  Benefits and risks of operative and nonoperative management were discussed prior to surgery with patient/guardian(s) and informed consent form was completed.  Infection and need for further surgery were discussed as was prosthetic stability and cuff issues.  We additionally specifically discussed risks of axillary nerve injury, infection, periprosthetic fracture, continued pain and longevity of implants prior to beginning procedure.   ? ? ? ?Procedure:   ?The patient was identified in the preoperative holding area where the surgical site was marked. Block placed by anesthesia with exparel.  The patient was taken to the OR where a procedural timeout was called and the above noted anesthesia was induced.  The patient was positioned beachchair on allen table with spider arm positioner.  Preoperative antibiotics were dosed.  The patient's right shoulder was prepped and  draped in the usual sterile fashion.  A second preoperative timeout was called.     ? ? ?Standard deltopectoral approach was performed with a #10 blade. We dissected down to  the subcutaneous tissues and the cephalic vein was taken laterally with the deltoid. Clavipectoral fascia was incised in line with the incision. Deep retractors were placed. The long of the biceps tendon was identified and there was significant tenosynovitis present.  Tenodesis was performed to the pectoralis tendon with #2 Ethibond. The remaining biceps was followed up into the rotator interval where it was released.  ?  ?We used the bicipital groove as a landmark for the lesser and greater tuberosity fragments.  We were able to mobilize the lesser tuberosity fragment and placed stay sutures in the bone tendon junction to help with mobilization.  This point we were able to identify the  greater tuberosity fragment and 4 #5  FiberWire sutures were used to place into this for eventual repair of the tuberosities.  Once these were both mobilized we took care to identify the shaft fragment as well as the head fragment.  We carefully identified the head fragment were able to manually remove it.  At this point the axillary nerve was found and palpated and with a tug test noted to be intact.  Protected throughout the remainder of the case with blunt retractors. ?  ? We then released the SGHL with bovie cautery prior to placing a curved mayo at the junction of the anterior glenoid well above the axillary nerve and bluntly dissecting the subscapularis from the capsule.  We then carefully protected the axillary nerve as we gently released the inferior capsule to fully mobilize the subscapularis.  An anterior deltoid retractor was then placed as well as a small Hohmann retractor superiorly. ?  ?The glenoid was relatively preserved as we would expect in this fracture patient.  The remaining labrum was removed circumferentially taking great care not to disrupt the posterior capsule.  ?  ?The glenoid drill guide was placed and used to drill a guide pin in the center, inferior position. The glenoid face was then reamed  concentrically over the guide wire. The center hole was drilled over the guidepin in a near anatomic angle of version. Next the glenoid vault was drilled back to a depth of 40 mm.  We tapped and then placed a 27mm size baseplate with 0 lateralization was selected with a 6.5 mm x 28mm length central screw.  The base plate was screwed into the glenoid vault obtaining secure fixation. We next placed superior and inferior locking screws for additional fixation.  Next a 36 mm glenosphere was selected and impacted onto the baseplate. The center screw was tightened. ?  ?We then repositioned the arm to give access to the humeral shaft fragment.  Drill holes were placed and fiberwire sutures in the shaft for vertical fixation of the tuberosities.  We broached starting with a size one broach and broaching up to 4 which obtained an appropriate fit above the pec.   ?We trialed with multiple size tray and polyethylene options and selected a 0 low which provided good stability and range of motion without excess soft tissue tension. The offset was dialed in to match the normal anatomy. The shoulder was trialed.  There was good ROM in all planes and the shoulder was stable with no inferior translation. ?  ?We then mobilized her tuberosities again and placed the anterior deep limbs of the 4 #5 fiber wires around  the stem.  1 of these was tied down fixing the greater tuberosity in place after bone graft harvest from the humeral head component was placed underneath.  A +0 low offset tray was selected and impacted onto the stem.   A 36+6 polyethylene liner was impacted onto the stem.  The joint was reduced and thoroughly irrigated with pulsatile lavage. The remaining sutures were then placed through the subscapularis and the bone tendon junction and the tuberosities were reduced after bone graft placed beneath as autograft at the subscap.  We horizontally secured the tuberosities before placing vertical fixation with the suture that  was placed into the shaft.  Tuberosities moved as a unit were happy with her overall reduction.  This was checked on fluoroscopy confirming our position.  We irrigated copiously at this point.  Hemostasis was obtai

## 2021-08-17 NOTE — Interval H&P Note (Signed)
All questions answered

## 2021-08-17 NOTE — Transfer of Care (Signed)
Immediate Anesthesia Transfer of Care Note ? ?Patient: Sara Dillon ? ?Procedure(s) Performed: Procedure(s): ?REVERSE SHOULDER ARTHROPLASTY (Right) ?OPEN REDUCTION INTERNAL FIXATION (ORIF) PROXIMAL HUMERUS FRACTURE (Right) ? ?Patient Location: PACU ? ?Anesthesia Type:General ? ?Level of Consciousness: Patient easily awoken, sedated, comfortable, cooperative, following commands, responds to stimulation.  ? ?Airway & Oxygen Therapy: Patient spontaneously breathing, ventilating well, oxygen via simple oxygen mask. ? ?Post-op Assessment: Report given to PACU RN, vital signs reviewed and stable, moving all extremities.  ? ?Post vital signs: Reviewed and stable. ? ?Complications: No apparent anesthesia complications ? ?Last Vitals:  ?Vitals Value Taken Time  ?BP 158/69 08/17/21 0915  ?Temp    ?Pulse 75 08/17/21 0917  ?Resp 21 08/17/21 0917  ?SpO2 100 % 08/17/21 0917  ?Vitals shown include unvalidated device data. ? ?Last Pain:  ?Vitals:  ? 08/17/21 0618  ?TempSrc: Oral  ?PainSc:   ?   ? ?  ? ?Complications: No notable events documented. ?

## 2021-08-17 NOTE — Anesthesia Procedure Notes (Signed)
Anesthesia Regional Block: Interscalene brachial plexus block  ? ?Pre-Anesthetic Checklist: , timeout performed,  Correct Patient, Correct Site, Correct Laterality,  Correct Procedure, Correct Position, site marked,  Risks and benefits discussed,  Surgical consent,  Pre-op evaluation,  At surgeon's request and post-op pain management ? ?Laterality: Right ? ?Prep: chloraprep     ?  ?Needles:  ?Injection technique: Single-shot ? ?Needle Type: Echogenic Stimulator Needle   ? ? ?Needle Length: 10cm  ?Needle Gauge: 20  ? ? ? ?Additional Needles: ? ? ?Procedures:,,,, ultrasound used (permanent image in chart),,    ?Narrative:  ?Start time: 08/17/2021 7:01 AM ?End time: 08/17/2021 7:05 AM ?Injection made incrementally with aspirations every 5 mL. ? ?Performed by: Personally  ?Anesthesiologist: Lucretia Kern, MD ? ?Additional Notes: ?Standard monitors applied. Skin prepped. Good needle visualization with ultrasound. Injection made in 5cc increments with no resistance to injection. Patient tolerated the procedure well. ? ? ? ? ?

## 2021-08-17 NOTE — Addendum Note (Signed)
Addendum  created 08/17/21 1033 by Ludwig Lean, CRNA  ? Intraprocedure Meds edited  ?  ?

## 2021-08-17 NOTE — Anesthesia Procedure Notes (Signed)
Procedure Name: Intubation ?Date/Time: 08/17/2021 7:38 AM ?Performed by: Deliah Boston, CRNA ?Pre-anesthesia Checklist: Patient identified, Emergency Drugs available, Suction available and Patient being monitored ?Patient Re-evaluated:Patient Re-evaluated prior to induction ?Oxygen Delivery Method: Circle system utilized ?Preoxygenation: Pre-oxygenation with 100% oxygen ?Induction Type: IV induction ?Ventilation: Mask ventilation without difficulty ?Laryngoscope Size: Mac and 3 ?Grade View: Grade I ?Tube type: Oral ?Tube size: 7.0 mm ?Number of attempts: 1 ?Airway Equipment and Method: Stylet and Oral airway ?Placement Confirmation: ETT inserted through vocal cords under direct vision, positive ETCO2 and breath sounds checked- equal and bilateral ?Secured at: 22 cm ?Tube secured with: Tape ?Dental Injury: Teeth and Oropharynx as per pre-operative assessment  ? ? ? ? ?

## 2021-08-17 NOTE — Discharge Instructions (Signed)
Ramond Marrow MD, MPH ?Alfonse Alpers, PA-C ?[facility] ?[address] ?[phone] (tel)   ?[phone] (fax) ? ? ?POST-OPERATIVE INSTRUCTIONS - TOTAL SHOULDER REPLACEMENT  ? ? ?WOUND CARE ?You may leave the operative dressing in place until your follow-up appointment. ?KEEP THE INCISIONS CLEAN AND DRY. ?There may be a small amount of fluid/bleeding leaking at the surgical site. This is normal after surgery.  ?If it fills with liquid or blood please call us immediately to change it for you. ?Use the provided ice machine or Ice packs as often as possible for the first 3-4 days, then as needed for pain relief.   ?Keep a layer of cloth or a shirt between your skin and the cooling unit to prevent frost bite as it can get very cold. ? ?SHOWERING: ?- You may shower on Post-Op Day #2.  ?- The dressing is water resistant but do not scrub it as it may start to peel up.   ?- You may remove the sling for showering ?- Gently pat the area dry.  ?- Do not soak the shoulder in water. Do not go swimming in the pool or ocean until your incision has completely healed (about 4-6 weeks after surgery) ?- KEEP THE INCISIONS CLEAN AND DRY. ? ?EXERCISES ?Wear the sling at all times ?You may remove the sling for showering, but keep the arm across the chest or in a secondary sling.    ?Accidental/Purposeful External Rotation and shoulder flexion (reaching behind you) is to be avoided at all costs for the first month. ?It is ok to come out of your sling if your are sitting and have assistance for eating.   ?Do not lift anything heavier than 1 pound until we discuss it further in clinic. ? ?REGIONAL ANESTHESIA (NERVE BLOCKS) ?The anesthesia team may have performed a nerve block for you if safe in the setting of your care.  This is a great tool used to minimize pain.  Typically the block may start wearing off overnight but the long acting medicine may last for 3-4 days.  The nerve block wearing off can be a  challenging period but please utilize your as needed pain medications to try and manage this period.   ? ?POST-OP MEDICATIONS- Multimodal approach to pain control ?In general your pain will be controlled with a combination of substances.  Prescriptions unless otherwise discussed are electronically sent to your pharmacy.  This is a carefully made plan we use to minimize narcotic use.    ? ?Meloxicam - Anti-inflammatory medication taken on a scheduled basis ?Acetaminophen - Non-narcotic pain medicine taken on a scheduled basis  ?Oxycodone - This is a strong narcotic, to be used only on an ?as needed? basis for SEVERE pain. ?Aspirin 81mg  - This medicine is used to minimize the risk of blood clots after surgery. ?Omeprazole - daily medicine to protect your stomach while taking anti-inflammatories.   ?Zofran -  take as needed for nausea ? ? ?FOLLOW-UP ?If you develop a Fever (>101.5), Redness or Drainage from the surgical incision site, please call our office to arrange for an evaluation. ?Please call the office to schedule a follow-up appointment for a wound check, 7-10 days post-operatively. ? ?IF YOU HAVE ANY QUESTIONS, PLEASE FEEL FREE TO CALL OUR OFFICE. ? ?HELPFUL INFORMATION ? ?If you had a block, it will wear off between 8-24 hrs postop typically.  This is period when your pain may go from nearly zero to the pain you would have  had post-op without the block.  This is an abrupt transition but nothing dangerous is happening.  You may take an extra dose of narcotic when this happens. ? ?Your arm will be in a sling following surgery. You will be in this sling for the next 4 weeks.   ? ?You may be more comfortable sleeping in a semi-seated position the first few nights following surgery.  Keep a pillow propped under the elbow and forearm for comfort.  If you have a recliner type of chair it might be beneficial.  If not that is fine too, but it would be helpful to sleep propped up with pillows behind your operated  shoulder as well under your elbow and forearm.  This will reduce pulling on the suture lines. ? ?When dressing, put your operative arm in the sleeve first.  When getting undressed, take your operative arm out last.  Loose fitting, button-down shirts are recommended. ? ?In most states it is against the law to drive while your arm is in a sling. And certainly against the law to drive while taking narcotics. ? ?You may return to work/school in the next couple of days when you feel up to it. Desk work and typing in the sling is fine. ? ?We suggest you use the pain medication the first night prior to going to bed, in order to ease any pain when the anesthesia wears off. You should avoid taking pain medications on an empty stomach as it will make you nauseous. ? ?Do not drink alcoholic beverages or take illicit drugs when taking pain medications. ? ?Pain medication may make you constipated.  Below are a few solutions to try in this order: ?Decrease the amount of pain medication if you aren?t having pain. ?Drink lots of decaffeinated fluids. ?Drink prune juice and/or each dried prunes ? ?If the first 3 don?t work start with additional solutions ?Take Colace - an over-the-counter stool softener ?Take Senokot - an over-the-counter laxative ?Take Miralax - a stronger over-the-counter laxative ? ? ?Dental Antibiotics: ? ?In most cases prophylactic antibiotics for Dental procdeures after total joint surgery are not necessary. ? ?Exceptions are as follows: ? ?1. History of prior total joint infection ? ?2. Severely immunocompromised (Organ Transplant, cancer chemotherapy, Rheumatoid biologic ?meds such as Humera) ? ?3. Poorly controlled diabetes (A1C &gt; 8.0, blood glucose over 200) ? ?If you have one of these conditions, contact your surgeon for an antibiotic prescription, prior to your ?dental procedure. ? ? ?For more information including helpful videos and documents visit our website:   ? ?[email or web address] ? ? ? ?

## 2021-08-18 ENCOUNTER — Encounter (HOSPITAL_COMMUNITY): Payer: Self-pay | Admitting: Orthopaedic Surgery

## 2023-03-04 IMAGING — DX DG SHOULDER 2+V*R*
1 series · 1 of 1 positions shown · non-contrast
Comparison: None
COMPARISON: None

Addendum:
CLINICAL DATA: Post shoulder surgery, imaging in recovery.

EXAM:
RIGHT SHOULDER - 2+ VIEW

[shoulder axial]
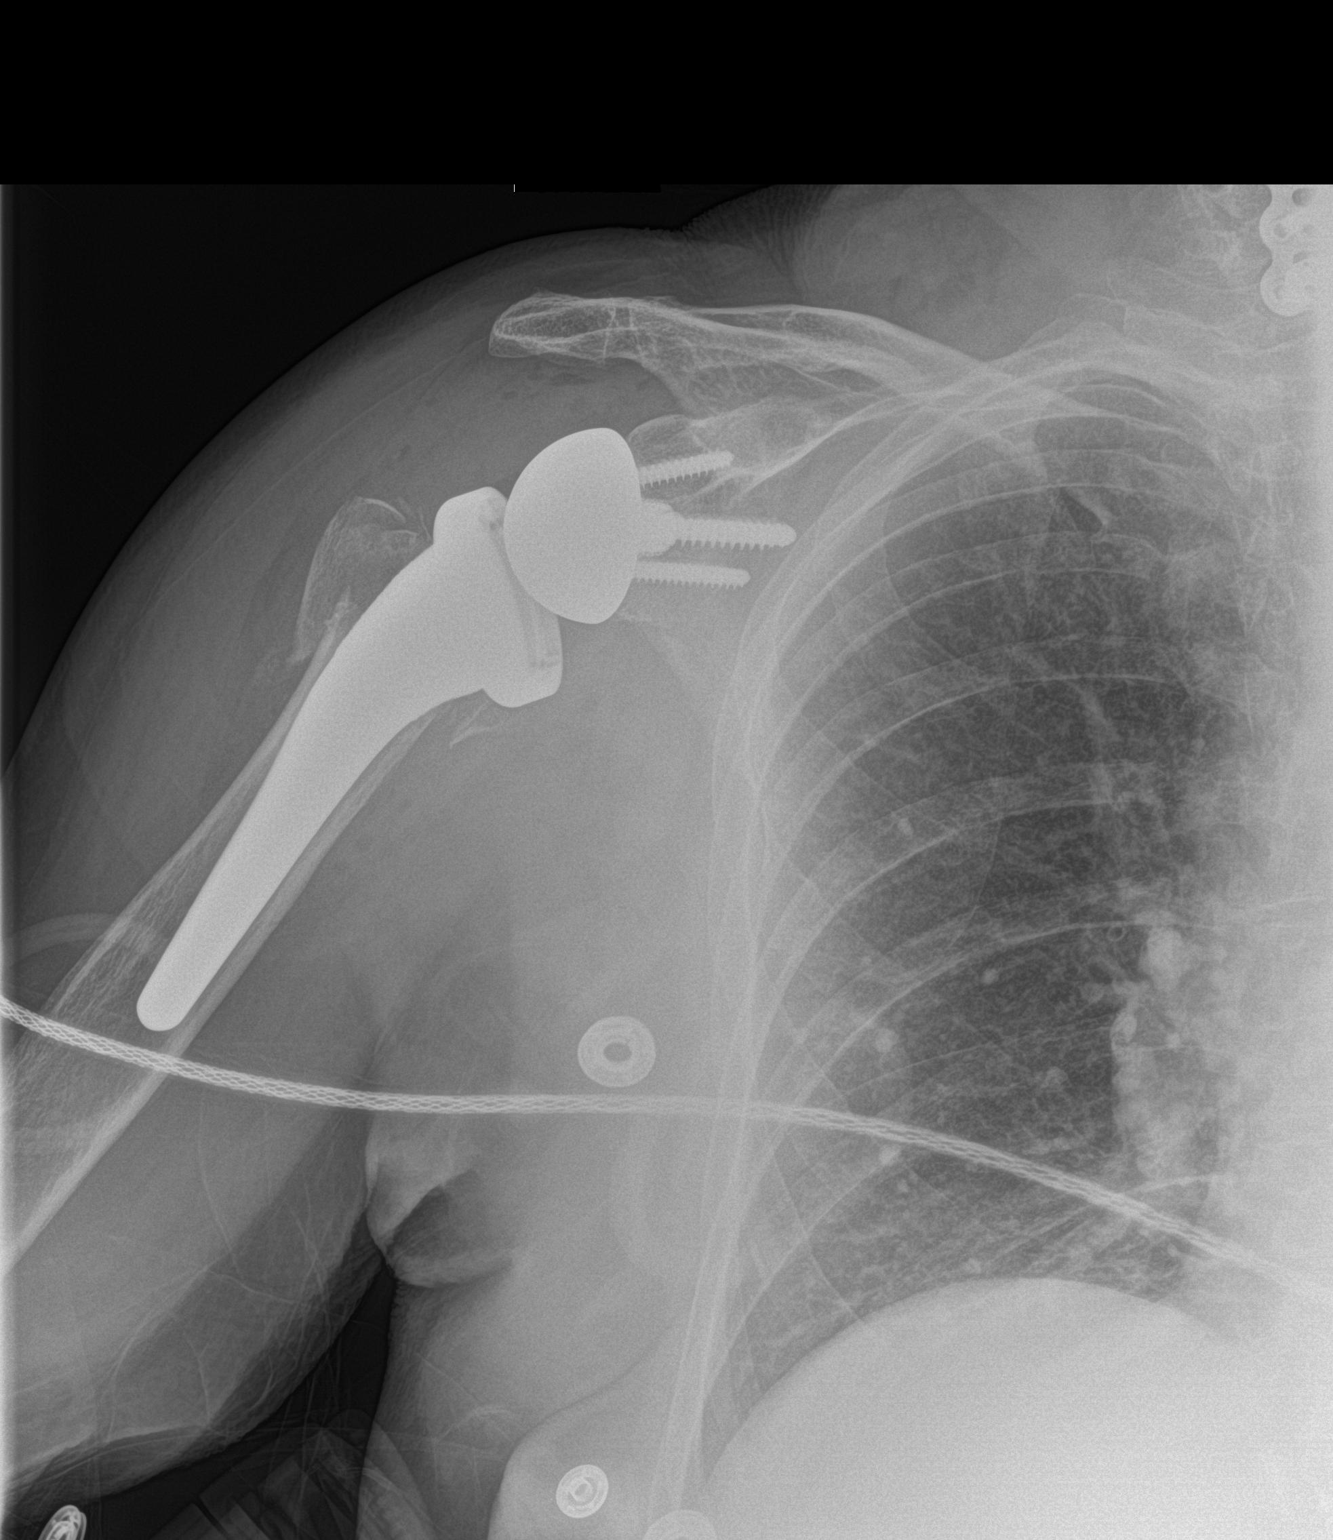

[1 of 1 positions shown; findings below may reference images not displayed]

FINDINGS: Periprosthetic fracture of the proximal RIGHT humerus with
fragmentation and separate fragment of the greater tuberosity.
Remainder of humeral-prosthetic interface on AP view is
unremarkable. Glenoid component of reverse shoulder arthroplasty is
unremarkable on AP projection.

Local gas in the soft tissues.
IMPRESSION: Periprosthetic fracture of the proximal RIGHT humerus with
fragmentation and separate fragment of the greater tuberosity which
is mild to moderately displaced. No additional unexpected findings
on AP projection/single-view.

These results will be called to the ordering clinician or
representative by the Radiologist Assistant, and communication
documented in the PACS or [REDACTED].

ADDENDUM:
By report the fragments of greater and lesser tuberosity were
"anchored" with radiolucent suture material. These would not be
visible on the current radiograph which shows some displacement of
these fragments from normal anatomic position.

*** End of Addendum ***
FINDINGS: Periprosthetic fracture of the proximal RIGHT humerus with
fragmentation and separate fragment of the greater tuberosity.
Remainder of humeral-prosthetic interface on AP view is
unremarkable. Glenoid component of reverse shoulder arthroplasty is
unremarkable on AP projection.

Local gas in the soft tissues.
IMPRESSION: Periprosthetic fracture of the proximal RIGHT humerus with
fragmentation and separate fragment of the greater tuberosity which
is mild to moderately displaced. No additional unexpected findings
on AP projection/single-view.

These results will be called to the ordering clinician or
representative by the Radiologist Assistant, and communication
documented in the PACS or [REDACTED].
